# Patient Record
Sex: Male | Born: 1970 | Race: White | Hispanic: No | Marital: Married | Smoking: Former smoker
Health system: Southern US, Community
[De-identification: ages and names within clinical notes are randomized; demographics above are authoritative.]

---

## 2010-04-19 ENCOUNTER — Ambulatory Visit: Payer: Self-pay | Admitting: Family Medicine

## 2010-04-19 DIAGNOSIS — IMO0002 Reserved for concepts with insufficient information to code with codable children: Secondary | ICD-10-CM

## 2010-07-10 ENCOUNTER — Ambulatory Visit: Payer: Self-pay | Admitting: Family Medicine

## 2010-07-10 DIAGNOSIS — J1189 Influenza due to unidentified influenza virus with other manifestations: Secondary | ICD-10-CM

## 2010-08-22 NOTE — Assessment & Plan Note (Signed)
Summary: SHARP ABDOMINAL PAIN IN RT SIDE/CJR   Vital Signs:  Patient profile:   40 year old male Weight:      186 pounds O2 Sat:      98 % Temp:     98.3 degrees F Pulse rate:   59 / minute BP sitting:   140 / 80  (left arm)  Vitals Entered By: Pura Spice, RN (April 19, 2010 4:28 PM) CC: pain rt quadrant x 2 months    History of Present Illness: 40 yr old male to establish with [place] and to discuss a 2 month hx of sharp pains in the RUQ. No hx of trauma but he is active, plays golf and works out at [employer] frequently. This pain is mild and does not stop him from continuing to play golf and work out. No back pain. No fever or nasuea. No change in urinations or BMs. Advil helps for awhile.   Preventive Screening-Counseling & Management  Alcohol-Tobacco     Smoking Status: never  Allergies (verified): No Known Drug Allergies  Past History:  Past Medical History: Unremarkable  Past Surgical History: Denies surgical history  Family History: Reviewed history and no changes required. unremarkable  Social History: Reviewed history and no changes required. Occupation: business Surveyor, mining Married Never Smoked Alcohol use-yes Occupation:  employed Smoking Status:  never  Review of Systems  The patient denies anorexia, fever, weight loss, weight gain, vision loss, decreased hearing, hoarseness, chest pain, syncope, dyspnea on exertion, peripheral edema, prolonged cough, headaches, hemoptysis, melena, hematochezia, severe indigestion/heartburn, hematuria, incontinence, genital sores, muscle weakness, suspicious skin lesions, transient blindness, difficulty walking, depression, unusual weight change, abnormal bleeding, enlarged lymph nodes, angioedema, breast masses, and testicular masses.    Physical Exam  General:  Well-developed,well-nourished,in no acute distress; alert,appropriate and cooperative throughout examination Neck:  No deformities, masses, or  tenderness noted. Lungs:  Normal respiratory effort, chest expands symmetrically. Lungs are clear to auscultation, no crackles or wheezes. Heart:  Normal rate and regular rhythm. S1 and S2 normal without gallop, murmur, click, rub or other extra sounds. Abdomen:  soft, normal bowel sounds, no distention, no masses, no guarding, no rigidity, no rebound tenderness, no abdominal hernia, no inguinal hernia, no hepatomegaly, and no splenomegaly.  Very slightly tender in the RUQ just beneath the lower rib margin    Impression & Recommendations:  Problem # 1:  MUSCLE STRAIN, ABDOMINAL WALL (ICD-848.8)  Patient Instructions: 1)  reassured him that this will resolve over time, but he needs to allow the muscle to rest and to heal. No golf or any workouts for one month.  2)  Please schedule a follow-up appointment as needed .

## 2010-08-24 NOTE — Assessment & Plan Note (Signed)
Summary: congestion//ccm   Vital Signs:  Patient profile:   40 year old male Temp:     100.7 degrees F BP sitting:   100 / 66  (left arm)  Vitals Entered By: Pura Spice, RN (July 10, 2010 9:47 AM) CC: fever weakness sore throat cough green sputum congestion   History of Present Illness: Here with his wife for one week of fevers, body aches, fatigue, dry coughing, HA, and nausea without vomiting. Everyone in his family got flu shots except for him. He just got back from spending several weeks in [place] for work.   Allergies (verified): No Known Drug Allergies  Past History:  Past Medical History: Reviewed history from 04/19/2010 and no changes required. Unremarkable  Review of Systems  The patient denies anorexia, weight loss, weight gain, vision loss, decreased hearing, hoarseness, chest pain, syncope, dyspnea on exertion, peripheral edema, hemoptysis, abdominal pain, melena, hematochezia, severe indigestion/heartburn, hematuria, incontinence, genital sores, muscle weakness, suspicious skin lesions, transient blindness, difficulty walking, depression, unusual weight change, abnormal bleeding, enlarged lymph nodes, angioedema, breast masses, and testicular masses.    Physical Exam  General:  alert but ill appearing  Head:  Normocephalic and atraumatic without obvious abnormalities. No apparent alopecia or balding. Eyes:  No corneal or conjunctival inflammation noted. EOMI. Perrla. Funduscopic exam benign, without hemorrhages, exudates or papilledema. Vision grossly normal. Ears:  External ear exam shows no significant lesions or deformities.  Otoscopic examination reveals clear canals, tympanic membranes are intact bilaterally without bulging, retraction, inflammation or discharge. Hearing is grossly normal bilaterally. Nose:  External nasal examination shows no deformity or inflammation. Nasal mucosa are pink and moist without lesions or exudates. Mouth:  Oral mucosa and  oropharynx without lesions or exudates.  Teeth in good repair. Neck:  No deformities, masses, or tenderness noted. Lungs:  Normal respiratory effort, chest expands symmetrically. Lungs are clear to auscultation, no crackles or wheezes.   Impression & Recommendations:  Problem # 1:  INFLUENZA (ICD-487.8)  Complete Medication List: 1)  Zithromax Z-pak 250 Mg Tabs (Azithromycin) .... As directed 2)  Hydromet 5-1.5 Mg/70ml Syrp (Hydrocodone-homatropine) .Marland Kitchen.. 1 tsp q 4 hours as needed cough  Patient Instructions: 1)  Please schedule a follow-up appointment as needed .  2)  Off work today and tomorrow. Prescriptions: HYDROMET 5-1.5 MG/5ML SYRP (HYDROCODONE-HOMATROPINE) 1 tsp q 4 hours as needed cough  #240 x 0   Entered and Authorized by:   Nelwyn Salisbury MD   Signed by:   Nelwyn Salisbury MD on 07/10/2010   Method used:   Print then Give to Patient   RxID:   [id number] ZITHROMAX Z-PAK 250 MG TABS (AZITHROMYCIN) as directed  #1 x 0   Entered and Authorized by:   Nelwyn Salisbury MD   Signed by:   Nelwyn Salisbury MD on 07/10/2010   Method used:   Print then Give to Patient   RxID:   [id number]    Orders Added: 1)  Est. Patient Level IV [02725]

## 2010-11-10 ENCOUNTER — Ambulatory Visit (INDEPENDENT_AMBULATORY_CARE_PROVIDER_SITE_OTHER): Payer: 59 | Admitting: Family Medicine

## 2010-11-10 ENCOUNTER — Encounter: Payer: Self-pay | Admitting: Family Medicine

## 2010-11-10 VITALS — BP 120/78 | HR 54 | Temp 98.1°F | Wt 182.0 lb

## 2010-11-10 DIAGNOSIS — J329 Chronic sinusitis, unspecified: Secondary | ICD-10-CM

## 2010-11-10 DIAGNOSIS — R109 Unspecified abdominal pain: Secondary | ICD-10-CM

## 2010-11-10 MED ORDER — AZITHROMYCIN 250 MG PO TABS: ORAL_TABLET | ORAL | Status: AC

## 2010-11-10 MED ORDER — METHYLPREDNISOLONE ACETATE 80 MG/ML IJ SUSP
120.0000 mg | Freq: Once | INTRAMUSCULAR | Status: AC
  Administered 2010-11-10: 120 mg via INTRAMUSCULAR

## 2010-11-10 NOTE — Progress Notes (Signed)
  Subjective:    Patient ID: Bryan Gutierrez, male    DOB: 1971-04-23, 40 y.o.   MRN: [id number]  HPI Here for 2 issues. First he has had one week of stuffy head, PND, ST, and a dry  cough. No fever. Second, he has had 9 months of a recurrent sharp pain in the right flank just at the inferior margin of the ribs. No SOB , no urinary or bowel complaints. We looked at this last fall and thought it may be a pulled muscle, but it has not changed at all during the time since.   Review of Systems  Constitutional: Negative.   HENT: Positive for congestion, postnasal drip and sinus pressure.   Eyes: Negative.   Respiratory: Positive for cough.   Gastrointestinal: Positive for abdominal pain.       Objective:   Physical Exam  Constitutional: He appears well-developed and well-nourished.  HENT:  Right Ear: External ear normal.  Nose: Nose normal.  Mouth/Throat: Oropharynx is clear and moist.  Eyes: Conjunctivae are normal. Pupils are equal, round, and reactive to light.  Neck: Normal range of motion. Neck supple.  Pulmonary/Chest: Effort normal and breath sounds normal.       Tender in the right flank just under the inferior rib margin, no masses   Abdominal: Soft. Bowel sounds are normal. He exhibits no distension and no mass. There is no tenderness. There is no rebound and no guarding.  Lymphadenopathy:    He has no cervical adenopathy.          Assessment & Plan:  Treat the sinusitis as above. The source of the flank pain is unclear, although an irritated nerve is most likely. Set up an MRI of the abdomen to rule out any structural problems.

## 2010-11-14 ENCOUNTER — Telehealth: Payer: Self-pay | Admitting: Family Medicine

## 2010-11-14 DIAGNOSIS — R109 Unspecified abdominal pain: Secondary | ICD-10-CM

## 2010-11-14 NOTE — Telephone Encounter (Signed)
Cancel the MRI order and instead get a non-contrasted abdomen CT

## 2010-11-17 ENCOUNTER — Ambulatory Visit (INDEPENDENT_AMBULATORY_CARE_PROVIDER_SITE_OTHER)
Admission: RE | Admit: 2010-11-17 | Discharge: 2010-11-17 | Disposition: A | Discharge status: Home / Self Care | Payer: 59 | Source: Ambulatory Visit | Attending: Family Medicine | Admitting: Family Medicine

## 2010-11-17 DIAGNOSIS — R109 Unspecified abdominal pain: Secondary | ICD-10-CM

## 2010-11-20 ENCOUNTER — Telehealth: Payer: Self-pay | Admitting: *Deleted

## 2010-11-20 DIAGNOSIS — R0781 Pleurodynia: Secondary | ICD-10-CM

## 2010-11-20 NOTE — Telephone Encounter (Signed)
Pt is still having pain and wants to know what the next step will be after the CT scan.

## 2010-11-21 ENCOUNTER — Telehealth: Payer: Self-pay | Admitting: *Deleted

## 2010-11-21 NOTE — Telephone Encounter (Signed)
Possibly a nerve block procedure could help. We will refer him to Dr. Adelene Amas to evaluate

## 2010-11-21 NOTE — Telephone Encounter (Signed)
ERROR

## 2010-11-21 NOTE — Telephone Encounter (Signed)
Pt. Informed.

## 2011-10-06 IMAGING — CT CT ABDOMEN W/O CM
2 of 4 series · 17 of 46 positions shown, 19 images · non-contrast
Comparison: None.

CLINICAL DATA: Right flank pain.

CT ABDOMEN WITHOUT CONTRAST
TECHNIQUE: Multidetector CT imaging of the abdomen was performed
following the standard protocol without IV contrast.

[Series 2: ap without · axial · non-contrast · 0.68mm/px · z∈[-338,-94]mm · 14 of 55 slices shown, 16 images]
[im 3/55  soft-tissue]
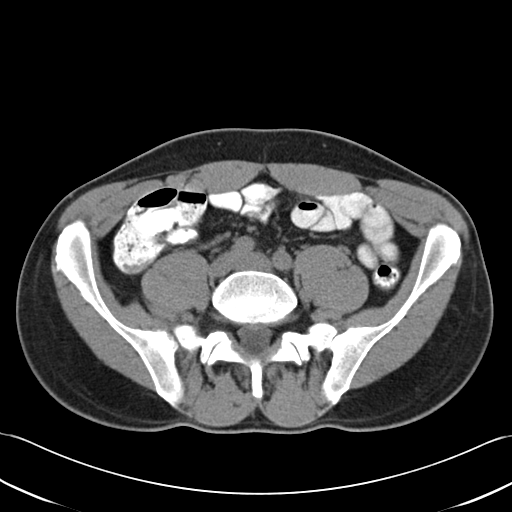
[im 3/55  bone]
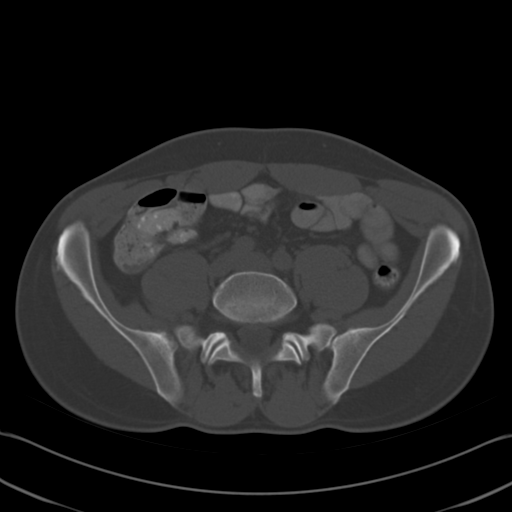
[im 8/55  soft-tissue]
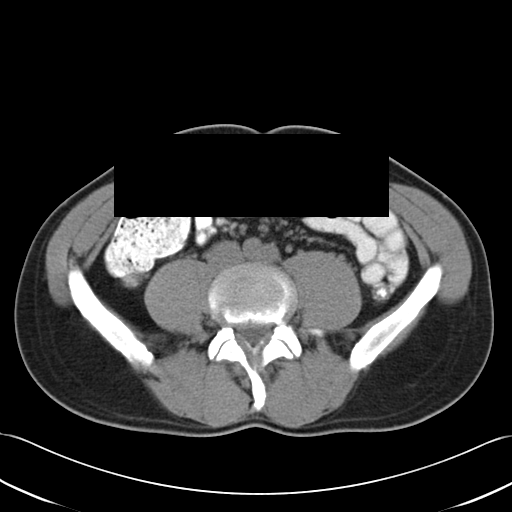
[im 10/55  soft-tissue]
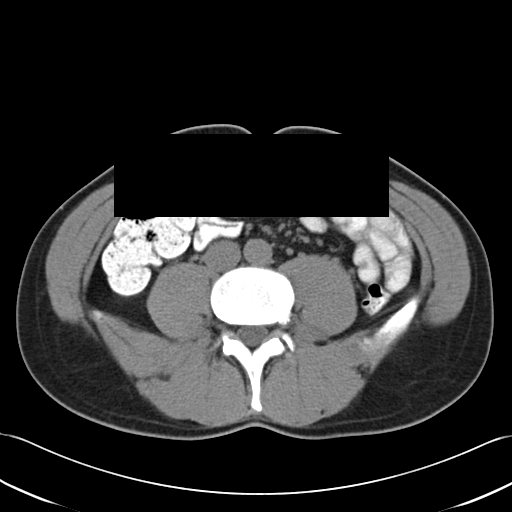
[im 15/55  soft-tissue]
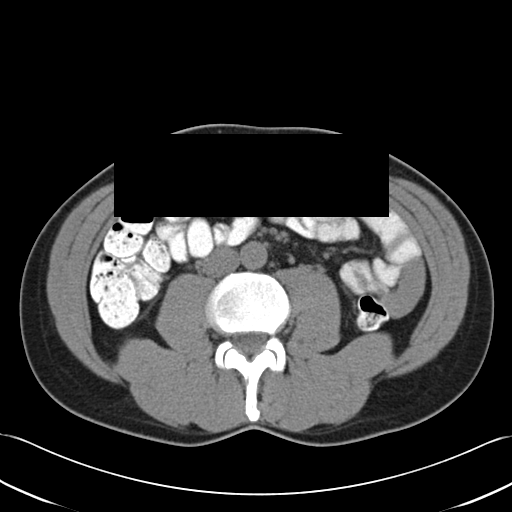
[im 19/55  soft-tissue]
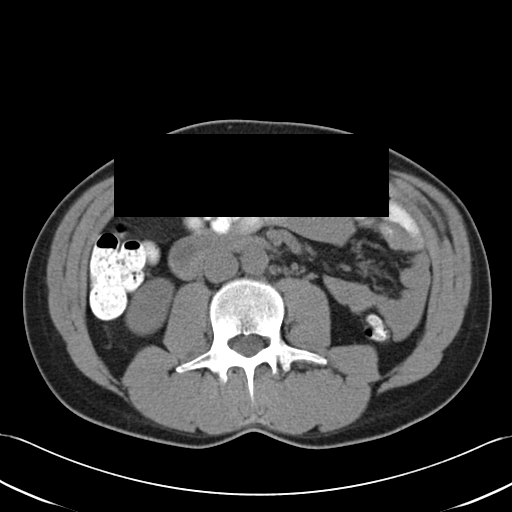
[im 22/55  soft-tissue]
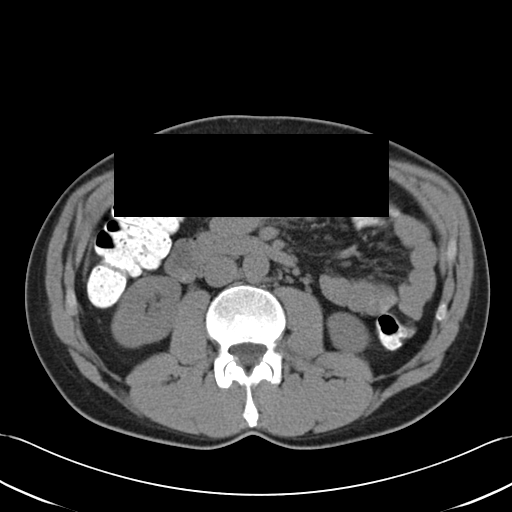
[im 26/55  soft-tissue]
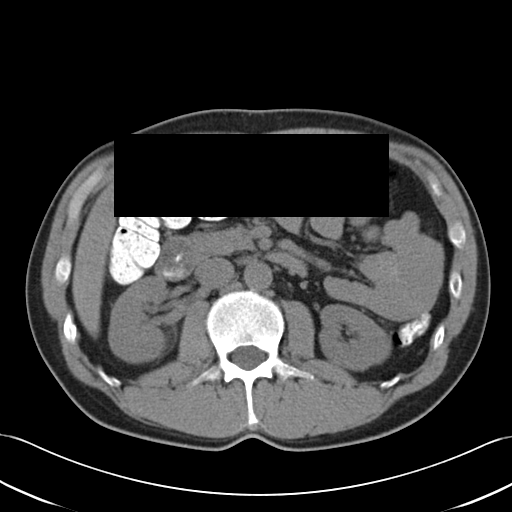
[im 29/55  soft-tissue]
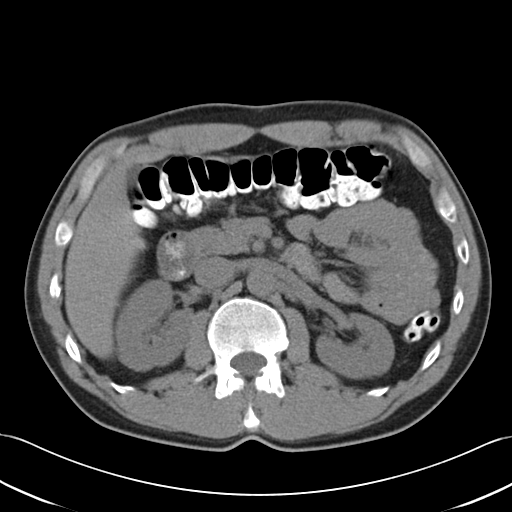
[im 33/55  soft-tissue]
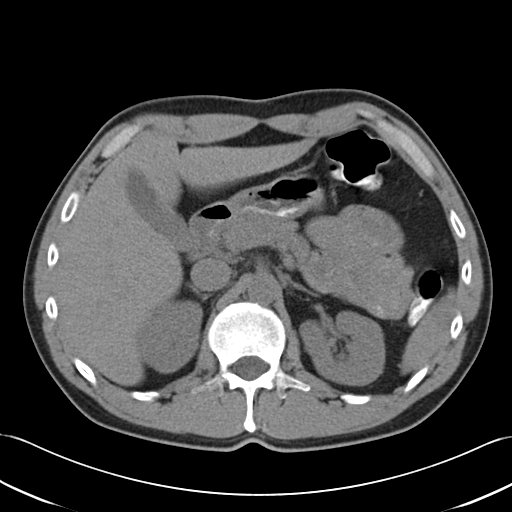
[im 33/55  bone]
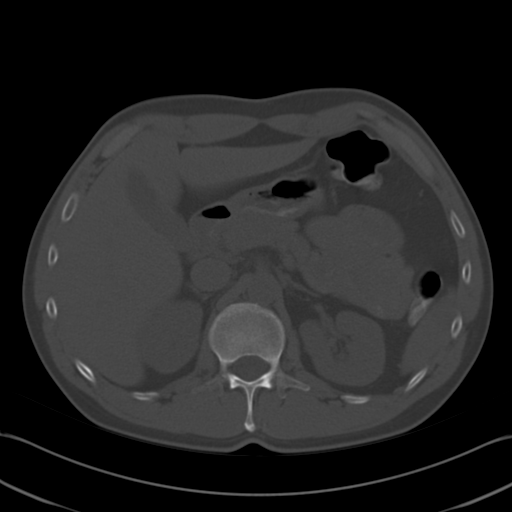
[im 36/55  soft-tissue]
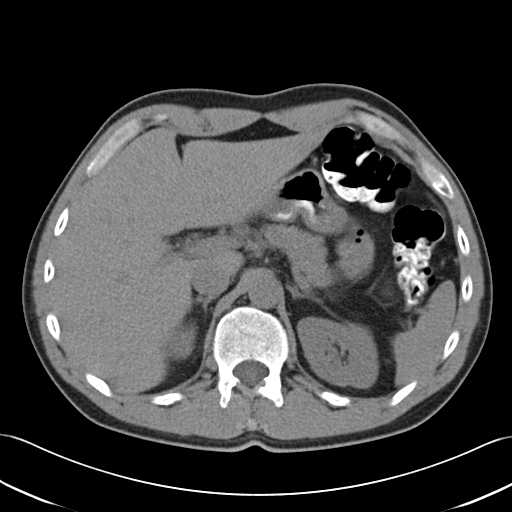
[im 40/55  soft-tissue]
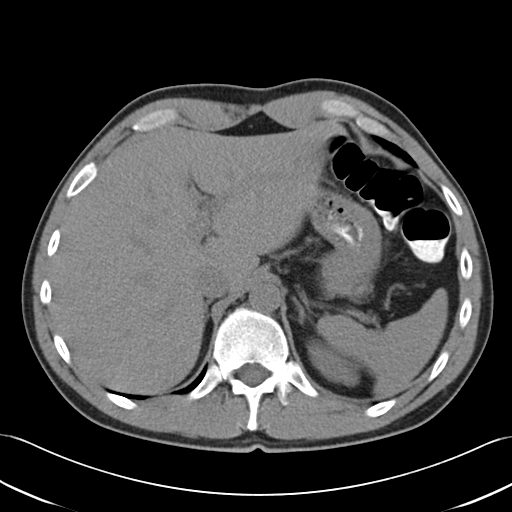
[im 45/55  soft-tissue]
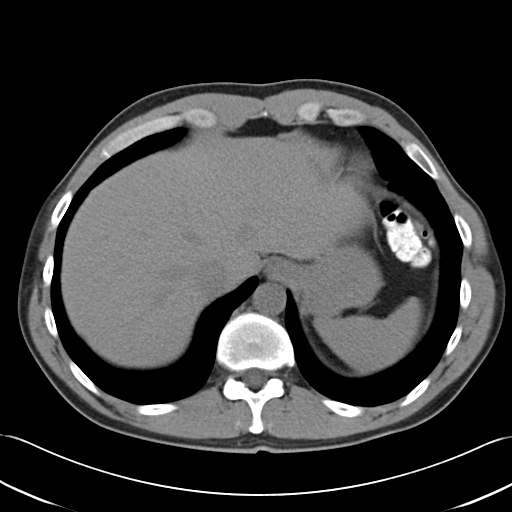
[im 47/55  soft-tissue]
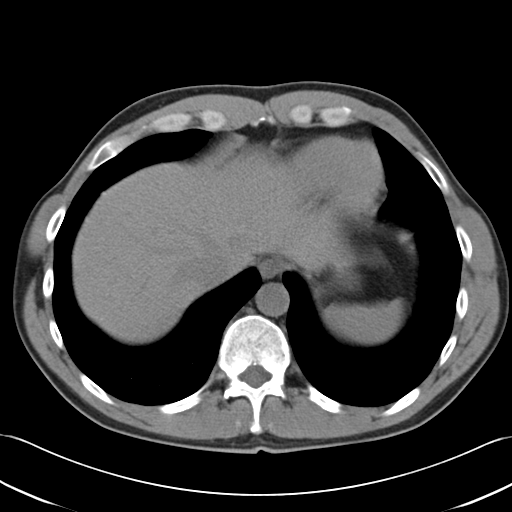
[im 52/55  soft-tissue]
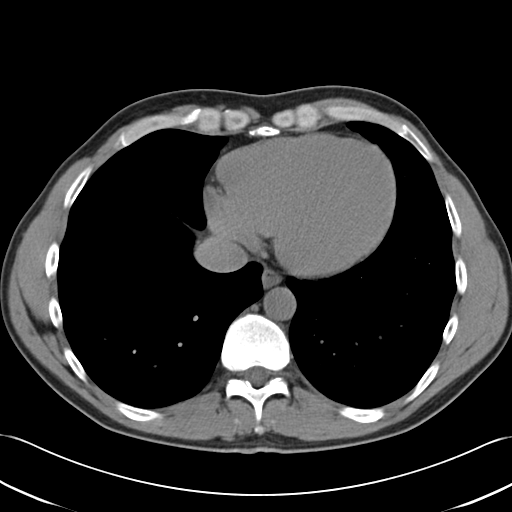

[Series 602: cor · coronal · 0.63mm/px · 3 of 101 slices shown]
[im 34/101  soft-tissue]
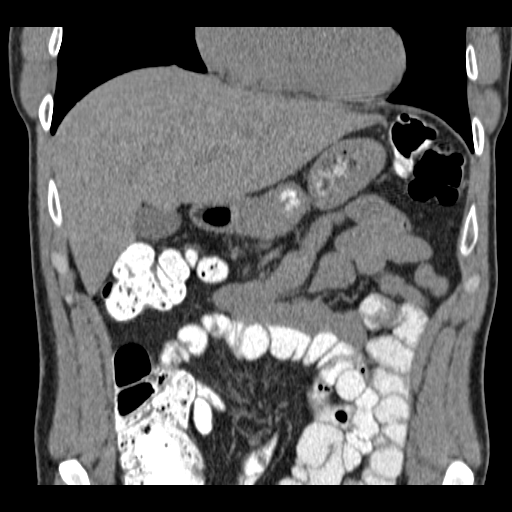
[im 45/101  soft-tissue]
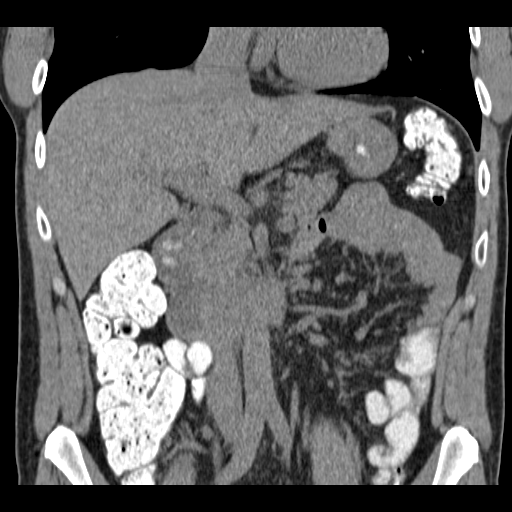
[im 56/101  soft-tissue]
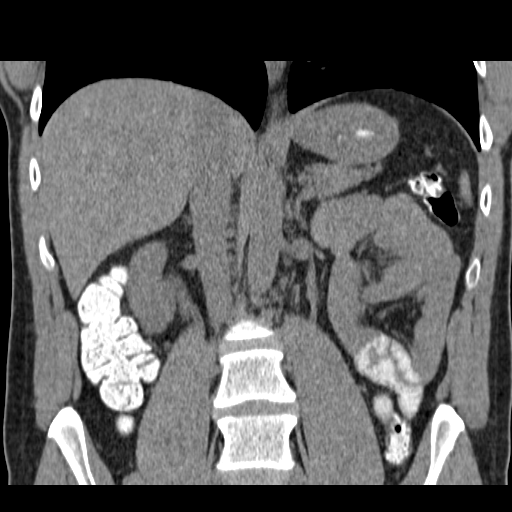

[17 of 46 positions shown; findings below may reference images not displayed]

FINDINGS: No specific lower rib abnormality on the right is
identified.  Regional musculature appears normal the lung bases
appear clear.

The xyphoid appears normal. The visualized portion of the liver,
spleen, pancreas, and adrenal glands appear unremarkable in
noncontrast CT appearance.

The gallbladder and biliary system appear unremarkable.

The kidneys appear unremarkable, as do the proximal ureters.

Orally administered contrast medium makes its way through to the
colon, which is not dilated in its visualized portion.  No dilated
small bowel in the upper abdomen is identified.

No pathologic retroperitoneal or porta hepatis adenopathy is
identified.
IMPRESSION: 1.  No significant abnormality is evident on noncontrast CT to
account the patient's pain of the right rib cage.  I did discuss
the results by telephone with Mr. Kymario at to [DATE] p.m. on
11/17/2010.

## 2012-11-21 ENCOUNTER — Telehealth: Payer: Self-pay | Admitting: Family Medicine

## 2012-11-21 NOTE — Telephone Encounter (Signed)
PT called and request a CPX next week. States that he is returning to [place] for a few months, the following week. Would you be able to fit him in anywhere? Please assist.

## 2012-11-21 NOTE — Telephone Encounter (Signed)
Per Dr. Fry okay to schedule 

## 2012-11-28 ENCOUNTER — Ambulatory Visit (INDEPENDENT_AMBULATORY_CARE_PROVIDER_SITE_OTHER): Payer: 59 | Admitting: Family Medicine

## 2012-11-28 ENCOUNTER — Encounter: Payer: Self-pay | Admitting: Family Medicine

## 2012-11-28 VITALS — BP 110/80 | Temp 97.7°F | Wt 180.0 lb

## 2012-11-28 DIAGNOSIS — J019 Acute sinusitis, unspecified: Secondary | ICD-10-CM

## 2012-11-28 MED ORDER — AZITHROMYCIN 250 MG PO TABS: ORAL_TABLET | ORAL | Status: DC

## 2012-11-28 NOTE — Progress Notes (Signed)
  Subjective:    Patient ID: Bryan Gutierrez, male    DOB: July 23, 1971, 42 y.o.   MRN: [id number]  HPI Here for 6 weeks of stuffy head, PND, ST, and a cough. The cough was dry at first but recently it has produced green sputum. No fever.    Review of Systems  Constitutional: Negative.   HENT: Positive for congestion, postnasal drip and sinus pressure.   Eyes: Negative.   Respiratory: Positive for cough.        Objective:   Physical Exam  Constitutional: He appears well-developed and well-nourished.  HENT:  Right Ear: External ear normal.  Left Ear: External ear normal.  Nose: Nose normal.  Mouth/Throat: Oropharynx is clear and moist.  Eyes: Conjunctivae are normal.  Pulmonary/Chest: Effort normal and breath sounds normal.  Lymphadenopathy:    He has no cervical adenopathy.          Assessment & Plan:  Add Mucinex D.

## 2012-12-05 ENCOUNTER — Encounter: Payer: Self-pay | Admitting: Family Medicine

## 2012-12-05 ENCOUNTER — Ambulatory Visit (INDEPENDENT_AMBULATORY_CARE_PROVIDER_SITE_OTHER): Payer: 59 | Admitting: Family Medicine

## 2012-12-05 VITALS — BP 110/70 | HR 63 | Temp 97.8°F | Ht 73.5 in | Wt 176.0 lb

## 2012-12-05 DIAGNOSIS — Z0000 Encounter for general adult medical examination without abnormal findings: Secondary | ICD-10-CM

## 2012-12-05 LAB — BASIC METABOLIC PANEL
CO2: 29 mEq/L (ref 19–32)
Calcium: 9.3 mg/dL (ref 8.4–10.5)
Creatinine, Ser: 1 mg/dL (ref 0.4–1.5)
GFR: 90.18 mL/min (ref >60.00)

## 2012-12-05 LAB — POCT URINALYSIS DIPSTICK
Bilirubin, UA: NEGATIVE
Glucose, UA: NEGATIVE
Leukocytes, UA: NEGATIVE
Nitrite, UA: NEGATIVE

## 2012-12-05 LAB — CBC WITH DIFFERENTIAL/PLATELET
Basophils Relative: 0.6 % (ref 0.0–3.0)
Eosinophils Absolute: 0.3 10*3/uL (ref 0.0–0.7)
Eosinophils Relative: 4.2 % (ref 0.0–5.0)
Lymphocytes Relative: 35 % (ref 12.0–46.0)
MCHC: 34.3 g/dL (ref 30.0–36.0)
Monocytes Relative: 8.4 % (ref 3.0–12.0)
Neutrophils Relative %: 51.8 % (ref 43.0–77.0)
RBC: 4.73 Mil/uL (ref 4.22–5.81)
WBC: 6.6 10*3/uL (ref 4.5–10.5)

## 2012-12-05 LAB — LIPID PANEL
HDL: 69.1 mg/dL (ref >39.00)
Total CHOL/HDL Ratio: 2
Triglycerides: 31 mg/dL (ref 0.0–149.0)
VLDL: 6.2 mg/dL (ref 0.0–40.0)

## 2012-12-05 LAB — HEPATIC FUNCTION PANEL
Albumin: 4.2 g/dL (ref 3.5–5.2)
Bilirubin, Direct: 0.1 mg/dL (ref 0.0–0.3)
Total Protein: 7.2 g/dL (ref 6.0–8.3)

## 2012-12-05 LAB — TSH: TSH: 0.78 u[IU]/mL (ref 0.35–5.50)

## 2012-12-05 NOTE — Progress Notes (Signed)
  Subjective:    Patient ID: Bryan Gutierrez, male    DOB: 24-Nov-1970, 42 y.o.   MRN: [id number]  HPI 42 yr old male for a cpx. He feels great and has no concerns. He recently recovered from a sinus infection.    Review of Systems  Constitutional: Negative.   HENT: Negative.   Eyes: Negative.   Respiratory: Negative.   Cardiovascular: Negative.   Gastrointestinal: Negative.   Genitourinary: Negative.   Musculoskeletal: Negative.   Skin: Negative.   Neurological: Negative.   Psychiatric/Behavioral: Negative.        Objective:   Physical Exam  Constitutional: He is oriented to person, place, and time. He appears well-developed and well-nourished. No distress.  HENT:  Head: Normocephalic and atraumatic.  Right Ear: External ear normal.  Left Ear: External ear normal.  Nose: Nose normal.  Mouth/Throat: Oropharynx is clear and moist. No oropharyngeal exudate.  Eyes: Conjunctivae and EOM are normal. Pupils are equal, round, and reactive to light. Right eye exhibits no discharge. Left eye exhibits no discharge. No scleral icterus.  Neck: Neck supple. No JVD present. No tracheal deviation present. No thyromegaly present.  Cardiovascular: Normal rate, regular rhythm, normal heart sounds and intact distal pulses.  Exam reveals no gallop and no friction rub.   No murmur heard. Pulmonary/Chest: Effort normal and breath sounds normal. No respiratory distress. He has no wheezes. He has no rales. He exhibits no tenderness.  Abdominal: Soft. Bowel sounds are normal. He exhibits no distension and no mass. There is no tenderness. There is no rebound and no guarding.  Genitourinary: Rectum normal, prostate normal and penis normal. Guaiac negative stool. No penile tenderness.  Musculoskeletal: Normal range of motion. He exhibits no edema and no tenderness.  Lymphadenopathy:    He has no cervical adenopathy.  Neurological: He is alert and oriented to person, place, and time. He has normal reflexes.  No cranial nerve deficit. He exhibits normal muscle tone. Coordination normal.  Skin: Skin is warm and dry. No rash noted. He is not diaphoretic. No erythema. No pallor.  Psychiatric: He has a normal mood and affect. His behavior is normal. Judgment and thought content normal.          Assessment & Plan:  Well exam. Get fasting labs.

## 2012-12-10 NOTE — Progress Notes (Signed)
Quick Note:  I left a voice message with results. ______

## 2013-05-28 ENCOUNTER — Other Ambulatory Visit: Payer: Self-pay

## 2014-04-02 ENCOUNTER — Encounter: Payer: Self-pay | Admitting: Family Medicine

## 2014-04-02 ENCOUNTER — Ambulatory Visit (INDEPENDENT_AMBULATORY_CARE_PROVIDER_SITE_OTHER): Payer: 59 | Admitting: Family Medicine

## 2014-04-02 VITALS — BP 100/72 | HR 54 | Temp 97.7°F | Ht 73.5 in | Wt 182.5 lb

## 2014-04-02 DIAGNOSIS — J329 Chronic sinusitis, unspecified: Secondary | ICD-10-CM

## 2014-04-02 DIAGNOSIS — J3089 Other allergic rhinitis: Secondary | ICD-10-CM

## 2014-04-02 MED ORDER — AMOXICILLIN-POT CLAVULANATE 875-125 MG PO TABS: 1.0000 | ORAL_TABLET | Freq: Two times a day (BID) | ORAL | Status: DC

## 2014-04-02 NOTE — Patient Instructions (Signed)
-  start AFRIN twice daily today - STOP in 4 days  -start flonase or nasocort - daily for 21 days  -zyrtec daily for a month  -if not getting better in 3-4 days or worsening start and complete the course of antibiotic  -follow up as needed

## 2014-04-02 NOTE — Progress Notes (Signed)
Pre visit review using our clinic review tool, if applicable. No additional management support is needed unless otherwise documented below in the visit note. 

## 2014-04-02 NOTE — Progress Notes (Signed)
No chief complaint on file.   HPI:  -started: started about 2 weeks ago - seems to be getting worse -symptoms:nasal congestion, sore throat, cough - now progressed to sinus pain and pressure -denies:fever, SOB, NVD, tooth pain -has tried: musinex DM, ibuprofen -sick contacts/travel/risks: denies flu exposure, tick exposure or or Ebola risks; 43 yo sick too -Hx of: allergies with season changes  ROS: See pertinent positives and negatives per HPI.  No past medical history on file.  No past surgical history on file.  No family history on file.  History   Social History  . Marital Status: Married    Spouse Name: N/A    Number of Children: N/A  . Years of Education: N/A   Social History Main Topics  . Smoking status: Former Smoker    Quit date: 07/23/2002  . Smokeless tobacco: Never Used  . Alcohol Use: 1.0 oz/week    2 drink(s) per week  . Drug Use: No  . Sexual Activity: None   Other Topics Concern  . None   Social History Narrative  . None    Current outpatient prescriptions:amoxicillin-clavulanate (AUGMENTIN) 875-125 MG per tablet, Take 1 tablet by mouth 2 (two) times daily., Disp: 20 tablet, Rfl: 0  EXAM:  Filed Vitals:   04/02/14 0807  BP: 100/72  Pulse: 54  Temp: 97.7 F (36.5 C)    Body mass index is 23.75 kg/(m^2).  GENERAL: vitals reviewed and listed above, alert, oriented, appears well hydrated and in no acute distress  HEENT: atraumatic, conjunttiva clear, no obvious abnormalities on inspection of external nose and ears, normal appearance of ear canals and TMs, clear nasal congestion, mild post oropharyngeal erythema with PND, no tonsillar edema or exudate, no sinus TTP  NECK: no obvious masses on inspection  LUNGS: clear to auscultation bilaterally, no wheezes, rales or rhonchi, good air movement  CV: HRRR, no peripheral edema  MS: moves all extremities without noticeable abnormality  PSYCH: pleasant and cooperative, no obvious depression  or anxiety  ASSESSMENT AND PLAN:  Discussed the following assessment and plan:  Rhinosinusitis - Plan: amoxicillin-clavulanate (AUGMENTIN) 875-125 MG per tablet  Other allergic rhinitis - Plan: amoxicillin-clavulanate (AUGMENTIN) 875-125 MG per tablet  -given HPI and exam findings today, a serious infection or illness is unlikely. We discussed potential etiologies, with VURI or allergic being most likely. We discussed treatment side effects, likely course, antibiotic misuse, transmission, and signs of developing a serious illness. -since leaving on trip out of country and possible bacterial sinusitis opted for starting aggressive allergy regimen and short course nasal decongestant with abx if persists or worsens -of course, we advised to return or notify a doctor immediately if symptoms worsen or persist or new concerns arise.    Patient Instructions  -start AFRIN twice daily today - STOP in 4 days  -start flonase or nasocort - daily for 21 days  -zyrtec daily for a month  -if not getting better in 3-4 days or worsening start and complete the course of antibiotic  -follow up as needed     KIM, HANNAH R.

## 2014-04-15 ENCOUNTER — Telehealth: Payer: Self-pay | Admitting: Family Medicine

## 2014-04-15 MED ORDER — AZITHROMYCIN 250 MG PO TABS: ORAL_TABLET | ORAL | Status: DC

## 2014-04-15 NOTE — Telephone Encounter (Signed)
Rx done and I called the pt and advised him to call back for an appt if he is not better and he agreed.

## 2014-04-15 NOTE — Telephone Encounter (Signed)
Pt seen by dr Selena Batten on 9/11 and still has sinus inf. 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  [facility]

## 2014-04-15 NOTE — Telephone Encounter (Signed)
Ok to rx zpack. Should be seen if worsening symptoms or if not improving with this treatment.

## 2014-05-13 ENCOUNTER — Telehealth: Payer: Self-pay | Admitting: Family Medicine

## 2014-05-13 NOTE — Telephone Encounter (Signed)
Patient Information:  Caller Name: Viviann SpareSteven  Phone: [phone]  Patient: Bryan Gutierrez, Bryan Gutierrez  Gender: Male  DOB: [birth year]  Age: [age] Years  PCP: Gershon CraneFry, [facility]  Office Follow Up:  Does the office need to follow up with this patient?: No  Instructions For The Office: N/A  RN Note:  Care Advice per Guidelines. Call back parameters stated.  Symptoms  Reason For Call & Symptoms: Onset of hemorrhoids 05/08/14 while doing lots of travel. Blood on the toilet paper 3 weeks ago. Now has exterior hemorrhoids. Not thrombosed.  Reviewed Health History In EMR: Yes  Reviewed Medications In EMR: Yes  Reviewed Allergies In EMR: Yes  Reviewed Surgeries / Procedures: Yes  Date of Onset of Symptoms: 05/08/2014  Treatments Tried: Proctocort  Treatments Tried Worked: No  Guideline(s) Used:  Rectal Symptoms  Disposition Per Guideline:   Home Care  Reason For Disposition Reached:   Mild rectal pain  Advice Given:  Treatment of Acute Rectal Pain due to Fecal Impaction:   SITZ Bath - Take a 20-minute bath in warm water. Add 2 oz (57 grams) of baking soda to the bath water. This is also called a "Sitz bath" and it often helps relax the anal sphincter and release the bowel movement (BM).  Suppository - If the sitz bath does not work, try 1 or 2 glycerin rectal suppositories, which you can get over-the-counter (OTC) at your local pharmacy.  Expected Course - Acute rectal pain should be completely relieved by these instructions. If the discomfort does not go away, you will need to be seen.  Treatment of Mild Rectal Pain:  Rectal pain and irritation can often be caused by either hemorrhoids or a tiny tear in the rectal opening (anal fissure). Small drops of blood can sometimes be seen on the toilet paper or stool in people with hemorrhoids or rectal irritation from hard bowel movements.  Warm SITZ Bath Twice a Day - Sit in a warm saline bath for 20 minutes bid to cleanse the area and to  promote healing. Add 2 ounces (57 grams) of table salt or baking soda to each tub of water. Afterwards, gently pat area dry with unscented toilet paper.  Topical Hydrocortisone Twice a Day - After taking a Sitz bath and drying your rectal area, apply 1% hydrocortisone ointment (OTC) bid to reduce irritation. Hydrocortisone is found in various OTC hemorrhoid medications (e.g., Anusol HC, Preparation H Hydrocortisone, Analpram HC Cream).  Treatment of Mild Rectal Itching:  The main treatment for rectal itching is keeping the rectal area clean and dry, and avoiding excessive rubbing or scratching. Loose cotton underwear helps keep the area dry.  Cleansing after a Bowel Movement - Cleanse the anus with warm water after each bowel movement. Use wet cotton or tissue. Pat the area dry using unscented toilet paper. Avoid rubbing area with toilet paper.  Topical Hydrocortisone Twice a Day - Apply 1% hydrocortisone ointment (OTC) bid to reduce irritation (e.g., Anusol HC, Preparation H Hydrocortisone).  Prevention - Avoid scented toilet products. Keep rectal area clean and dry.  Call Back If:  Severe rectal pain or itching  Rectal pain or itching lasts over 3 days  Rectal bleeding (i.e., more than just a few drops on toilet paper from wiping)  You become worse.  Patient Will Follow Care Advice:  YES

## 2014-05-13 NOTE — Telephone Encounter (Signed)
Looks like FYI

## 2014-06-11 ENCOUNTER — Telehealth: Payer: Self-pay | Admitting: Family Medicine

## 2014-06-11 NOTE — Telephone Encounter (Signed)
Okay to schedule

## 2014-06-11 NOTE — Telephone Encounter (Signed)
Pt said [place] is moving to [place] and would like to know if Dr Clent RidgesFry can do a physical for him the first week of  Dec.

## 2014-06-14 NOTE — Telephone Encounter (Signed)
Pt scheduled  

## 2014-06-23 ENCOUNTER — Ambulatory Visit (INDEPENDENT_AMBULATORY_CARE_PROVIDER_SITE_OTHER): Payer: 59 | Admitting: Family Medicine

## 2014-06-23 ENCOUNTER — Encounter: Payer: Self-pay | Admitting: Family Medicine

## 2014-06-23 VITALS — BP 106/64 | HR 82 | Temp 98.1°F | Ht 73.5 in | Wt 180.0 lb

## 2014-06-23 DIAGNOSIS — Z0000 Encounter for general adult medical examination without abnormal findings: Secondary | ICD-10-CM

## 2014-06-23 LAB — CBC WITH DIFFERENTIAL/PLATELET
BASOS PCT: 0.2 % (ref 0.0–3.0)
Basophils Absolute: 0 10*3/uL (ref 0.0–0.1)
EOS ABS: 0 10*3/uL (ref 0.0–0.7)
EOS PCT: 0.4 % (ref 0.0–5.0)
HEMATOCRIT: 42.1 % (ref 39.0–52.0)
HEMOGLOBIN: 14.1 g/dL (ref 13.0–17.0)
LYMPHS ABS: 1.9 10*3/uL (ref 0.7–4.0)
Lymphocytes Relative: 20 % (ref 12.0–46.0)
MCHC: 33.4 g/dL (ref 30.0–36.0)
MCV: 91.8 fl (ref 78.0–100.0)
MONO ABS: 1.1 10*3/uL — AB (ref 0.1–1.0)
Monocytes Relative: 11.4 % (ref 3.0–12.0)
NEUTROS ABS: 6.5 10*3/uL (ref 1.4–7.7)
NEUTROS PCT: 68 % (ref 43.0–77.0)
Platelets: 306 10*3/uL (ref 150.0–400.0)
RBC: 4.59 Mil/uL (ref 4.22–5.81)
RDW: 13.1 % (ref 11.5–15.5)
WBC: 9.5 10*3/uL (ref 4.0–10.5)

## 2014-06-23 LAB — LIPID PANEL
CHOLESTEROL: 135 mg/dL (ref 0–200)
HDL: 53.1 mg/dL (ref >39.00)
LDL CALC: 65 mg/dL (ref 0–99)
NONHDL: 81.9
Total CHOL/HDL Ratio: 3
Triglycerides: 84 mg/dL (ref 0.0–149.0)
VLDL: 16.8 mg/dL (ref 0.0–40.0)

## 2014-06-23 LAB — BASIC METABOLIC PANEL
BUN: 14 mg/dL (ref 6–23)
CALCIUM: 8.9 mg/dL (ref 8.4–10.5)
CO2: 27 mEq/L (ref 19–32)
Chloride: 99 mEq/L (ref 96–112)
Creatinine, Ser: 1.1 mg/dL (ref 0.4–1.5)
GFR: 79.94 mL/min (ref >60.00)
GLUCOSE: 89 mg/dL (ref 70–99)
POTASSIUM: 3.4 meq/L — AB (ref 3.5–5.1)
SODIUM: 135 meq/L (ref 135–145)

## 2014-06-23 LAB — HEPATIC FUNCTION PANEL
ALBUMIN: 3.8 g/dL (ref 3.5–5.2)
ALK PHOS: 44 U/L (ref 39–117)
ALT: 11 U/L (ref 0–53)
AST: 21 U/L (ref 0–37)
BILIRUBIN DIRECT: 0 mg/dL (ref 0.0–0.3)
Total Bilirubin: 0.7 mg/dL (ref 0.2–1.2)
Total Protein: 7.7 g/dL (ref 6.0–8.3)

## 2014-06-23 LAB — POCT URINALYSIS DIPSTICK
BILIRUBIN UA: NEGATIVE
GLUCOSE UA: NEGATIVE
Leukocytes, UA: NEGATIVE
NITRITE UA: NEGATIVE
PH UA: 5.5
RBC UA: NEGATIVE
SPEC GRAV UA: 1.02
Urobilinogen, UA: 0.2

## 2014-06-23 LAB — TSH: TSH: 1 u[IU]/mL (ref 0.35–4.50)

## 2014-06-23 MED ORDER — CETIRIZINE HCL 10 MG PO TABS: 10.0000 mg | ORAL_TABLET | Freq: Every day | ORAL | Status: AC

## 2014-06-23 MED ORDER — AZELASTINE HCL 0.1 % NA SOLN: 2.0000 | Freq: Two times a day (BID) | NASAL | Status: AC

## 2014-06-23 NOTE — Progress Notes (Signed)
Pre visit review using our clinic review tool, if applicable. No additional management support is needed unless otherwise documented below in the visit note. 

## 2014-06-23 NOTE — Progress Notes (Signed)
   Subjective:    Patient ID: Bryan Gutierrez, male    DOB: Oct 06, 1970, 43 y.o.   MRN: [id number]  HPI 43 yr old male for a cpx. His only concern is about his allergies. He has constant nasal congestion and PND despite taking Zyrtec. He tried Flonase for 30 days but this did not help. He is moving to [place] for a job relocation next week.    Review of Systems  Constitutional: Negative.   HENT: Positive for congestion, postnasal drip and rhinorrhea. Negative for dental problem, drooling, ear discharge, ear pain, facial swelling, hearing loss, mouth sores, nosebleeds, sneezing, sore throat, tinnitus, trouble swallowing and voice change.   Eyes: Negative.   Respiratory: Negative.   Cardiovascular: Negative.   Gastrointestinal: Negative.   Genitourinary: Negative.   Musculoskeletal: Negative.   Skin: Negative.   Neurological: Negative.   Psychiatric/Behavioral: Negative.        Objective:   Physical Exam  Constitutional: He is oriented to person, place, and time. He appears well-developed and well-nourished. No distress.  HENT:  Head: Normocephalic and atraumatic.  Right Ear: External ear normal.  Left Ear: External ear normal.  Nose: Nose normal.  Mouth/Throat: Oropharynx is clear and moist. No oropharyngeal exudate.  Eyes: Conjunctivae and EOM are normal. Pupils are equal, round, and reactive to light. Right eye exhibits no discharge. Left eye exhibits no discharge. No scleral icterus.  Neck: Neck supple. No JVD present. No tracheal deviation present. No thyromegaly present.  Cardiovascular: Normal rate, regular rhythm, normal heart sounds and intact distal pulses.  Exam reveals no gallop and no friction rub.   No murmur heard. Pulmonary/Chest: Effort normal and breath sounds normal. No respiratory distress. He has no wheezes. He has no rales. He exhibits no tenderness.  Abdominal: Soft. Bowel sounds are normal. He exhibits no distension and no mass. There is no tenderness. There is  no rebound and no guarding.  Genitourinary: Rectum normal, prostate normal and penis normal. Guaiac negative stool. No penile tenderness.  Musculoskeletal: Normal range of motion. He exhibits no edema or tenderness.  Lymphadenopathy:    He has no cervical adenopathy.  Neurological: He is alert and oriented to person, place, and time. He has normal reflexes. No cranial nerve deficit. He exhibits normal muscle tone. Coordination normal.  Skin: Skin is warm and dry. No rash noted. He is not diaphoretic. No erythema. No pallor.  Psychiatric: He has a normal mood and affect. His behavior is normal. Judgment and thought content normal.          Assessment & Plan:  Well exam. Get fasting labs. Try Astelin sprays.

## 2017-04-11 ENCOUNTER — Encounter: Payer: Self-pay | Admitting: Family Medicine
